# Patient Record
Sex: Female | Born: 1940 | Race: White | Hispanic: No | Marital: Married | Smoking: Never smoker
Health system: Southern US, Community
[De-identification: ages and names within clinical notes are randomized; demographics above are authoritative.]

## PROBLEM LIST (undated history)

## (undated) HISTORY — PX: REPLACEMENT TOTAL KNEE: SUR1224

## (undated) HISTORY — PX: CATARACT EXTRACTION: SUR2

---

## 2004-08-24 ENCOUNTER — Encounter: Admission: RE | Admit: 2004-08-24 | Discharge: 2004-08-24 | Payer: Self-pay | Admitting: Neurosurgery

## 2005-10-18 ENCOUNTER — Ambulatory Visit (HOSPITAL_COMMUNITY): Admission: RE | Admit: 2005-10-18 | Discharge: 2005-10-18 | Payer: Self-pay | Admitting: Neurosurgery

## 2007-11-26 ENCOUNTER — Encounter: Admission: RE | Admit: 2007-11-26 | Discharge: 2007-11-26 | Payer: Self-pay | Admitting: Family Medicine

## 2007-12-23 ENCOUNTER — Encounter: Admission: RE | Admit: 2007-12-23 | Discharge: 2007-12-23 | Payer: Self-pay | Admitting: Neurosurgery

## 2008-04-04 ENCOUNTER — Inpatient Hospital Stay (HOSPITAL_COMMUNITY): Admission: RE | Admit: 2008-04-04 | Discharge: 2008-04-05 | Payer: Self-pay | Admitting: Neurosurgery

## 2008-04-15 ENCOUNTER — Encounter: Admission: RE | Admit: 2008-04-15 | Discharge: 2008-04-15 | Payer: Self-pay | Admitting: Neurosurgery

## 2008-04-21 ENCOUNTER — Inpatient Hospital Stay (HOSPITAL_COMMUNITY): Admission: RE | Admit: 2008-04-21 | Discharge: 2008-04-24 | Payer: Self-pay | Admitting: Neurosurgery

## 2008-06-27 ENCOUNTER — Inpatient Hospital Stay (HOSPITAL_COMMUNITY): Admission: RE | Admit: 2008-06-27 | Discharge: 2008-06-28 | Payer: Self-pay | Admitting: Neurosurgery

## 2008-10-14 ENCOUNTER — Encounter: Admission: RE | Admit: 2008-10-14 | Discharge: 2008-10-14 | Payer: Self-pay | Admitting: Orthopedic Surgery

## 2010-02-15 ENCOUNTER — Encounter: Payer: Self-pay | Admitting: Orthopedic Surgery

## 2010-05-03 LAB — BASIC METABOLIC PANEL
BUN: 4 mg/dL — ABNORMAL LOW (ref 6–23)
Creatinine, Ser: 0.82 mg/dL (ref 0.4–1.2)
Glucose, Bld: 141 mg/dL — ABNORMAL HIGH (ref 70–99)
Sodium: 132 mEq/L — ABNORMAL LOW (ref 135–145)

## 2010-05-03 LAB — CBC
MCHC: 34.4 g/dL (ref 30.0–36.0)
MCV: 96 fL (ref 78.0–100.0)

## 2010-05-06 LAB — URINALYSIS, ROUTINE W REFLEX MICROSCOPIC
Glucose, UA: NEGATIVE mg/dL
Glucose, UA: NEGATIVE mg/dL
Hgb urine dipstick: NEGATIVE
Ketones, ur: NEGATIVE mg/dL
Protein, ur: NEGATIVE mg/dL
Specific Gravity, Urine: 1.012 (ref 1.005–1.030)
pH: 6.5 (ref 5.0–8.0)

## 2010-05-06 LAB — COMPREHENSIVE METABOLIC PANEL
AST: 33 U/L (ref 0–37)
AST: 30 U/L (ref 0–37)
Albumin: 3.3 g/dL — ABNORMAL LOW (ref 3.5–5.2)
Albumin: 3.6 g/dL (ref 3.5–5.2)
Alkaline Phosphatase: 48 U/L (ref 39–117)
Chloride: 103 mEq/L (ref 96–112)
Creatinine, Ser: 0.95 mg/dL (ref 0.4–1.2)
GFR calc Af Amer: >60 mL/min (ref >60)
GFR calc Af Amer: >60 mL/min (ref >60)
Glucose, Bld: 95 mg/dL (ref 70–99)
Potassium: 3.1 mEq/L — ABNORMAL LOW (ref 3.5–5.1)
Potassium: 3.4 mEq/L — ABNORMAL LOW (ref 3.5–5.1)
Sodium: 127 mEq/L — ABNORMAL LOW (ref 135–145)
Total Bilirubin: 0.7 mg/dL (ref 0.3–1.2)
Total Bilirubin: 0.8 mg/dL (ref 0.3–1.2)
Total Protein: 6.3 g/dL (ref 6.0–8.3)

## 2010-05-06 LAB — DIFFERENTIAL
Basophils Absolute: 0 10*3/uL (ref 0.0–0.1)
Basophils Relative: 0 % (ref 0–1)
Basophils Relative: 0 % (ref 0–1)
Eosinophils Absolute: 0.1 10*3/uL (ref 0.0–0.7)
Eosinophils Relative: 1 % (ref 0–5)
Lymphocytes Relative: 15 % (ref 12–46)
Lymphocytes Relative: 20 % (ref 12–46)
Monocytes Absolute: 0.7 10*3/uL (ref 0.1–1.0)
Monocytes Absolute: 0.7 10*3/uL (ref 0.1–1.0)
Monocytes Relative: 6 % (ref 3–12)
Monocytes Relative: 8 % (ref 3–12)
Neutro Abs: 9.7 10*3/uL — ABNORMAL HIGH (ref 1.7–7.7)

## 2010-05-06 LAB — URINE MICROSCOPIC-ADD ON

## 2010-05-06 LAB — BASIC METABOLIC PANEL
BUN: 4 mg/dL — ABNORMAL LOW (ref 6–23)
Calcium: 8.6 mg/dL (ref 8.4–10.5)
Creatinine, Ser: 0.84 mg/dL (ref 0.4–1.2)
GFR calc non Af Amer: >60 mL/min (ref >60)
Potassium: 3.8 mEq/L (ref 3.5–5.1)

## 2010-05-06 LAB — PROTIME-INR: Prothrombin Time: 11.8 seconds (ref 11.6–15.2)

## 2010-05-06 LAB — CBC
Hemoglobin: 13.4 g/dL (ref 12.0–15.0)
MCHC: 33.8 g/dL (ref 30.0–36.0)
Platelets: 261 10*3/uL (ref 150–400)
WBC: 8.7 10*3/uL (ref 4.0–10.5)
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
## 2010-06-08 NOTE — Op Note (Signed)
NAME:  Tonya [name], Tonya [name]               ACCOUNT NO.:  [id number]   MEDICAL RECORD NO.:  [id number]          PATIENT TYPE:  INP   LOCATION:  3001                         FACILITY:  [facility]   PHYSICIAN:  Danae Orleans. Venetia Maxon, M.D.  DATE OF BIRTH:  1940-11-12   DATE OF PROCEDURE:  06/27/2008  DATE OF DISCHARGE:                               OPERATIVE REPORT   PREOPERATIVE DIAGNOSIS:  L5 compression fracture.   POSTOPERATIVE DIAGNOSIS:  L5 compression fracture.   PROCEDURE:  L5 kyphoplasty.   SURGEON:  Danae Orleans. Venetia Maxon, M.D.   ANESTHESIA:  General endotracheal anesthesia.   ESTIMATED BLOOD LOSS:  Minimal.   COMPLICATIONS:  None.   DISPOSITION:  Recovery.   INDICATIONS:  [name] is a 70 year old woman with severe  osteoporosis who has undergone kyphoplasty procedure of L4, has now  developed compression fracture of L5, elected to perform a kyphoplasty  of this vertebral level.   PROCEDURE:  [name] was brought to the operating room.  Following  satisfactory and uncomplicated induction of general endotracheal  anesthesia and placement of intravenous lines, the patient was placed in  prone position on the operating table on chest and pelvic rolls.  Her  back was prepped and draped in usual sterile fashion after using biplane  C-arm fluoroscopy to localize the L5 level.  The skin and subcutaneous  tissues were infiltrated with local lidocaine.  Subsequently, an  introducer was placed initially on the right and subsequently on the  left and then 20 mL inflatable bone tamps were inserted in the vertebra  and with good filling and height restoration of vertebral fracture.  The  bone cement was then mixed and 9 mL of bone cement were placed, 3  complete fills on either side with good interdigitation within the  vertebra and good filling in AP and lateral and cephalocaudal  projections.  The introducers were removed and two 3-0 Vicryl stitches  were placed with Dermabond was used to  dress the skin edges.  The  patient was then extubated and taken to recovery in stable and  satisfactory condition having tolerated the operation well.  Counts were  correct at the end of the case.      Danae Orleans. Venetia Maxon, M.D.  Electronically Signed    JDS/MEDQ  D:  06/27/2008  T:  06/28/2008  Job:  [id number]

## 2010-06-08 NOTE — Op Note (Signed)
NAME:  Tonya [name], SHERE NO.:  [id number]   MEDICAL RECORD NO.:  [id number]          PATIENT TYPE:  INP   LOCATION:  3010                         FACILITY:  [facility]   PHYSICIAN:  Payton Doughty, M.D.      DATE OF BIRTH:  Jul 08, 1940   DATE OF PROCEDURE:  04/21/2008  DATE OF DISCHARGE:                               OPERATIVE REPORT   PREOPERATIVE DIAGNOSES:  1. L4 compression fracture.  2. L3-L4 lateral recess narrowing with a right L4 radiculopathy.   PROCEDURES:  1. L4 kyphoplasty done by Dr. Danielle Dess.  2. L3-L4 laminotomy and foraminotomy done by Dr. Channing Mutters.   SURGEON:  Payton Doughty, MD   ANESTHESIA:  General endotracheal.   PREPARATION:  Prepped and draped with alcohol wipe.   COMPLICATIONS:  None.   NURSE ASSISTANT:  Bedelia Person, MD   DOCTOR ASSISTANT:  Hilda Lias, MD   BODY OF TEXT:  A 70 year old girl had a L2-L3 laminotomy done 2 weeks  ago has done well, had a precipitous increasing low back pain and right  leg pain, and has an MR showing a new compression fracture at L4 and  lateral recess narrowing at L3-L4 and taken to the operating room.  Smoothly anesthetized, intubated, and placed prone on the operating  table.  Following shave, prep, and drape in the usual sterile fashion,  Dr. Danielle Dess performed the L4 kyphoplasty.  He will dictate that under  separate cover.  Attention was then turned to the lamina.  An incision  was made from the top of L3 to the bottom of L3 and the lamina of L3 and  the top of the lamina of L4 exposing the right-sided subperiosteal  plane.  Intraoperative x-ray confirmed correctness level.  Having  confirmed correctness level, hemi-semi-laminectomy was carried out of L3  up to the top of ligamentum flavum that was removed in retrograde  fashion.  L4 was undercut as well.  This allowed decompression of the  lateral recess.  L3-L4 roots were identified as they traversed this area  with the L3 exiting the neural foramen and the L4  traversing the disk  space.  They were completely decompressed.  Depo-Medrol soaked fat was  placed in the laminotomy defect.  Successive layers of 0 Vicryl, 2-0  Vicryl, and 3-0 nylon were used to close.  Betadine and Telfa dressing  was applied and made occlusive with OpSite.  The patient returned to the  recovery room in good condition.          ______________________________  Payton Doughty, M.D.    MWR/MEDQ  D:  04/21/2008  T:  04/21/2008  Job:  [id number]
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
## 2010-06-08 NOTE — Discharge Summary (Signed)
NAME:  Tonya [name], Tonya [name] NO.:  [id number]   MEDICAL RECORD NO.:  [id number]          PATIENT TYPE:  INP   LOCATION:  3010                         FACILITY:  [facility]   PHYSICIAN:  Payton Doughty, M.D.      DATE OF BIRTH:  1940-11-09   DATE OF ADMISSION:  04/21/2008  DATE OF DISCHARGE:  04/24/2008                               DISCHARGE SUMMARY   ADMITTING DIAGNOSES:  L3-L4 compression fracture and L3-L4 lateral  recess narrowing on the right.   DISCHARGE DIAGNOSES:  L3-L4 compression fracture and L3-L4 lateral  recess narrowing on the right.   PROCEDURES:  L4 kyphoplasty and L3-L4 right laminotomy/foraminotomy.   SURGEON:  Payton Doughty, MD   This is a 70 year old right-handed white girl whose history and physical  is recounted in the chart.  She basically had a decompression at T3 a  couple of weeks ago, did well, developed pain in her back and right leg.  MR showed a new compression fracture at L4 as well as right L4 lateral  recess narrowing.  She was admitted after ascertaining normal laboratory  values and underwent an L4 vertebroplasty and then L3-L4  laminotomy/foraminotomy on the right.  Postoperatively, she had some  persistent knee pain, but certainly better than it was.  She has been  placed on Neurontin for this.  She is up and about, able to walk.  She  does use a rolling walker on request.  Her strength is full.  Her  incision is well healing.  She is being discharged home in the care of  her family.  Her followup will be in the [employer].           ______________________________  Payton Doughty, M.D.     MWR/MEDQ  D:  04/24/2008  T:  04/24/2008  Job:  [id number]

## 2010-06-08 NOTE — Op Note (Signed)
NAME:  Tonya, [name] NO.:  [id number]   MEDICAL RECORD NO.:  [id number]          PATIENT TYPE:  INP   LOCATION:  2899                         FACILITY:  [facility]   PHYSICIAN:  Payton Doughty, M.D.      DATE OF BIRTH:  1940/06/28   DATE OF PROCEDURE:  04/04/2008  DATE OF DISCHARGE:                               OPERATIVE REPORT   PREOPERATIVE DIAGNOSIS:  Right L3 radiculopathy related to spondylosis  and synovial cyst.   POSTOPERATIVE DIAGNOSIS:  Right L3 radiculopathy related to spondylosis  and synovial cyst.   OPERATIVE PROCEDURE:  Right L2-L3 laminectomy for resection of a  synovial cyst.   SURGEON:  Payton Doughty, MD, Neurosurgery.   ANESTHESIA:  General endotracheal.   PREPARATION:  Prepped and draped with alcohol wipe.   COMPLICATIONS:  None.   NURSE ASSISTANT:  [place].   DOCTOR ASSISTANT:  Hilda Lias, M.D.   BODY OF TEXT:  A 70 year old girl with a right L3 radiculopathy related  to spondylosis and synovial cyst at L2-3.  She was taken to the  operating room, smoothly anesthetized and intubated, placed supine on  the operating table.  Pressure points were padded following shave, prep,  drape in the usual sterile fashion.  Marker was placed.  X-ray was  obtained to get this in the right area.  Incision was made directly over  the L2 lamina.  The lamina was dissected free in the subperiosteal  plane.  Second intraoperative x-ray confirmed correctness level.  Having  confirmed correctness level, hemi semi-laminectomy of L2 was carried out  to the top of ligamentum flavum and was removed in a retrograde fashion.  The medial portion of the facet was also removed.  The pars reticularis  was not disrupted.  This allowed the resection of the ligamentum flavum  and decompression of the right L3 nerve root.  Contained within the  ligamentum flavum was a synovial cyst.  Resection of this soft tissue  mass resulted in immediate decompression of the right L3  root.  This  carefully followed with a nerve hook out to the neural foramen and found  to be free.  The wound was irrigated and hemostasis assured.  It was  covered with Depo-Medrol soaked fat.  Successive layers of 0 Vicryl, 2-0  Vicryl, and 3-0 nylon were used to close.  Betadine and Telfa dressing  was applied and made occlusive with OpSite.  The patient returned to  recovery room in good condition.            ______________________________  Payton Doughty, M.D.    MWR/MEDQ  D:  04/04/2008  T:  04/05/2008  Job:  [id number]

## 2010-06-11 NOTE — Op Note (Signed)
NAME:  Tonya [name], Tonya [name]               ACCOUNT NO.:  [id number]   MEDICAL RECORD NO.:  [id number]          PATIENT TYPE:  INP   LOCATION:  3010                         FACILITY:  [facility]   PHYSICIAN:  Stefani Dama, M.D.  DATE OF BIRTH:  1940/12/25   DATE OF PROCEDURE:  06/05/2008  DATE OF DISCHARGE:  04/24/2008                               OPERATIVE REPORT   PREOPERATIVE DIAGNOSES:  1. L4 vertebral pathologic fracture.  2. Osteoporosis.   POSTOPERATIVE DIAGNOSIS:  1. L4 vertebral pathologic fracture.  2. Osteoporosis.   PROCEDURE:  Acrylic balloon kyphoplasty, L4.   SURGEON:  Stefani Dama, MD   ANESTHESIA:  General endotracheal.   INDICATIONS:  Tonya [name] is an 70 year old individual who has had  vertebral pathologic compression fracture, secondary to osteoporosis and  a simple full.  She has back pain in addition to radicular pain and Dr.  Channing Mutters asked me to see the patient to do acrylic balloon kyphoplasty while  his portion of procedure would include laminotomy and decompression of  the nerve root.   PROCEDURE:  The patient was brought to the operating room supine on a  stretcher.  After smooth induction of general endotracheal anesthesia,  she was turned prone.  The back was prepped with alcohol and DuraPrep,  and draped in a sterile fashion.  Then, using fluoroscopic guidance in  AP and lateral projection, the L4 vertebral body was identified; and  using fluoroscopic guidance, the extra pedicular space at L4 on the left  side was entered with a Jamshidi needle.  Skin above this area was  infiltrated with 2 mL of lidocaine with epinephrine and a #11 blade was  used to create a stab incision in this area.  The Jamshidi needle was  then passed through the lateral aspect of the pedicle at L4 and into the  top of the vertebral body, and once the vertebral body was entered, the  inner cannula of the Jamshidi needle was removed and a core biopsy of  bone was obtained.  The  opening was then infiltrated with the balloon,  and the balloon was infiltrated, and an AP and lateral radiography was  noted to extend across the midline.  The balloon was infiltrated to 150  mmHg pressure, then rapidly deteriorated to approximately 80-90 mm.  Once adequate filling and decompression of the vertebral body was  obtained via this technique, glue was allowed to harden to the  appropriate consistency after being mixed and when it reached the  appropriate level of patchiness, three cannulas of 1.5 mL of glue were  inserted into the vertebral body with good filling across the midline.  Once  filling reached posterior aspect of the vertebral body, it was decided  to stop the procedure.  The cannula was removed and a singular stitch  was placed in the entry site.  The patient tolerated the procedure well  and was then turned over to Dr. Trey Sailors for reprepping and redraping to  perform the decompressive portion of the operation.      Stefani Dama, M.D.  Electronically Signed  Stefani Dama, M.D.  Electronically Signed    HJE/MEDQ  D:  06/05/2008  T:  06/05/2008  Job:  [id number]

## 2010-07-05 IMAGING — CR DG HIP COMPLETE 2+V*R*
2 series · 2 of 2 positions shown · non-contrast
Comparison: None.

CLINICAL DATA: Right hip and low back pain.  Question compression
fracture.

RIGHT HIP - COMPLETE 2+ VIEW

[view not recorded (1 of 2)]
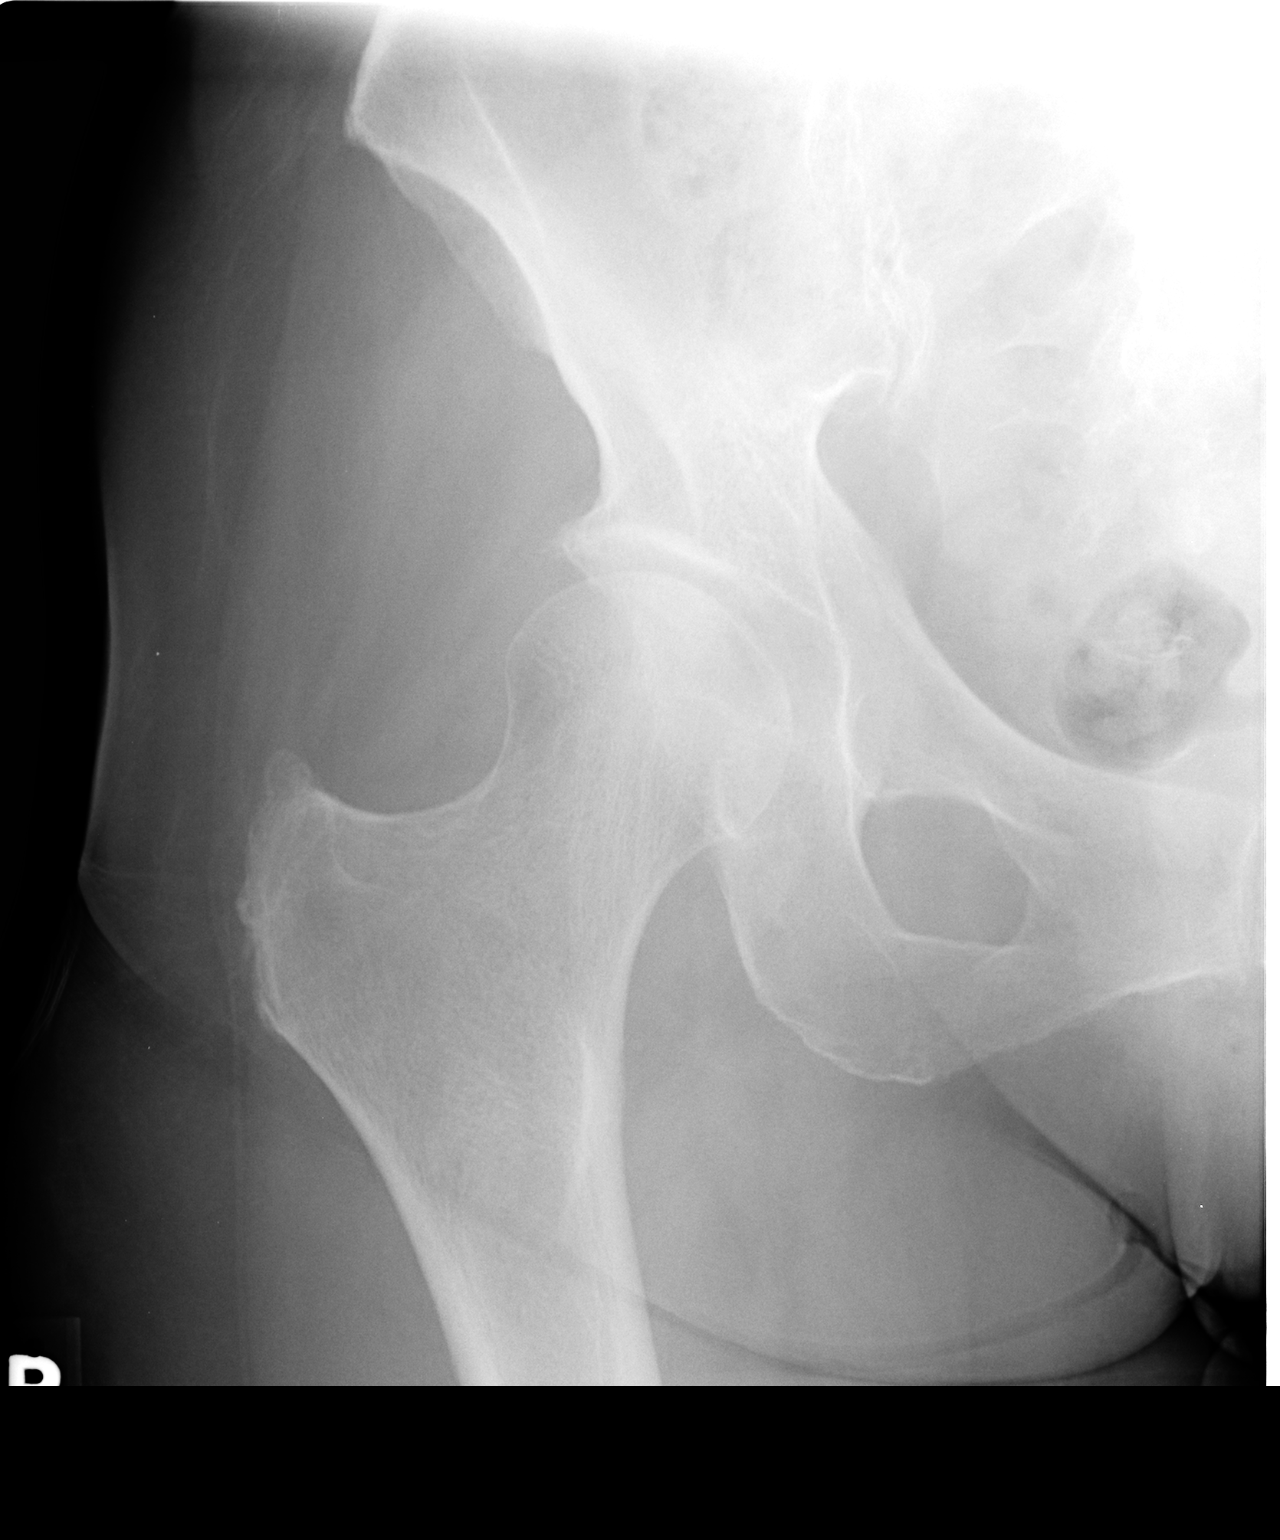

[view not recorded (2 of 2)]
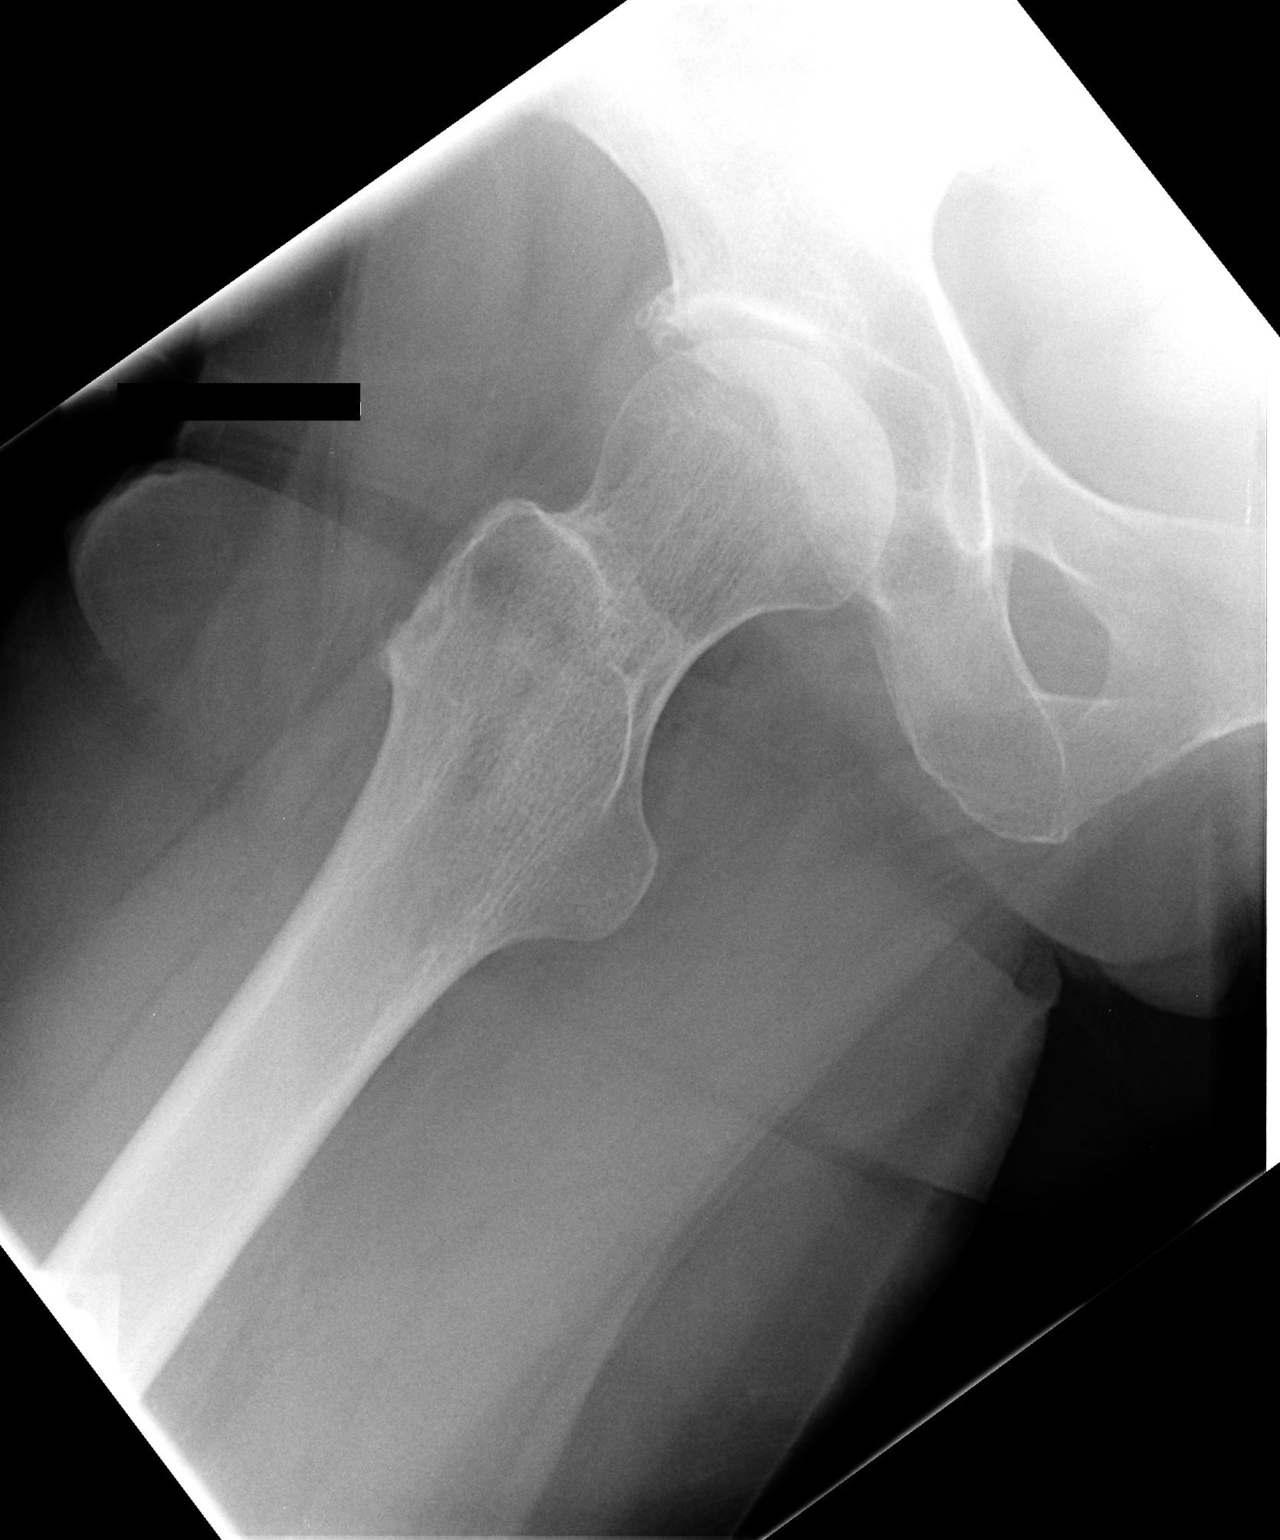

[2 of 2 positions shown; findings below may reference images not displayed]

FINDINGS: No fracture or dislocation.  Right hip joint space is
relatively well maintained.  There is mild subchondral sclerosis
and osteophytosis along the right acetabulum.
IMPRESSION: Degenerative changes without acute finding.

## 2012-11-26 ENCOUNTER — Ambulatory Visit
Admission: RE | Admit: 2012-11-26 | Discharge: 2012-11-26 | Disposition: A | Discharge status: Home / Self Care | Payer: PRIVATE HEALTH INSURANCE | Source: Ambulatory Visit | Attending: Neurosurgery | Admitting: Neurosurgery

## 2012-11-26 ENCOUNTER — Other Ambulatory Visit: Payer: Self-pay | Admitting: Neurosurgery

## 2012-11-26 DIAGNOSIS — M47816 Spondylosis without myelopathy or radiculopathy, lumbar region: Secondary | ICD-10-CM

## 2014-12-25 ENCOUNTER — Other Ambulatory Visit: Payer: Self-pay | Admitting: Neurosurgery

## 2014-12-25 ENCOUNTER — Ambulatory Visit (INDEPENDENT_AMBULATORY_CARE_PROVIDER_SITE_OTHER): Payer: PRIVATE HEALTH INSURANCE

## 2014-12-25 DIAGNOSIS — M545 Low back pain: Secondary | ICD-10-CM | POA: Diagnosis not present

## 2014-12-25 DIAGNOSIS — R52 Pain, unspecified: Secondary | ICD-10-CM

## 2015-10-02 ENCOUNTER — Emergency Department (INDEPENDENT_AMBULATORY_CARE_PROVIDER_SITE_OTHER)
Admission: EM | Admit: 2015-10-02 | Discharge: 2015-10-02 | Disposition: A | Discharge status: Home / Self Care | Payer: Medicare Other | Source: Home / Self Care | Attending: Family Medicine | Admitting: Family Medicine

## 2015-10-02 ENCOUNTER — Encounter: Payer: Self-pay | Admitting: *Deleted

## 2015-10-02 ENCOUNTER — Emergency Department (INDEPENDENT_AMBULATORY_CARE_PROVIDER_SITE_OTHER): Payer: Medicare Other

## 2015-10-02 DIAGNOSIS — R0781 Pleurodynia: Secondary | ICD-10-CM

## 2015-10-02 DIAGNOSIS — S2241XA Multiple fractures of ribs, right side, initial encounter for closed fracture: Secondary | ICD-10-CM | POA: Diagnosis not present

## 2015-10-02 DIAGNOSIS — S2241XK Multiple fractures of ribs, right side, subsequent encounter for fracture with nonunion: Secondary | ICD-10-CM

## 2015-10-02 DIAGNOSIS — W19XXXD Unspecified fall, subsequent encounter: Secondary | ICD-10-CM | POA: Diagnosis not present

## 2015-10-02 MED ORDER — CYCLOBENZAPRINE HCL 5 MG PO TABS: 5.0000 mg | ORAL_TABLET | Freq: Three times a day (TID) | ORAL | 0 refills | Status: AC | PRN

## 2015-10-02 NOTE — ED Provider Notes (Signed)
CSN: [id number]     Arrival date & time 10/02/15  1510 History   First MD Initiated Contact with Patient 10/02/15 1544     Chief Complaint  Patient presents with  . Chest Pain    Right lower rib pain   (Consider location/radiation/quality/duration/timing/severity/associated sxs/prior Treatment) HPI  Tonya [name] is a 75 y.o. female presenting to UC with c/o Right side lower rib pain that started this morning.  Pain was severe this morning and sharp, now 3/10. She has been taking tramadol as prescribed by her PCP for chronic back pain, it also helps with her rib pain.  She reports breaking 5 ribs about 1 month ago after tripping and falling. Pain had gradually resolved but this morning pain was so severe she is concerned she may have another fracture in her rib or injured her lung. Denies SOB. No new falls or injuries.  She does have a back brace she wears on occasion due to severe scoliosis but she cannot wear for long amounts of time due to fibromyalgia.  She f/u with her PCP last week and everything was going well. She does have hx of osteopenia per her "back doctor." denies fever, chills, n/v/d, cough or congestion.    History reviewed. No pertinent past medical history. Past Surgical History:  Procedure Laterality Date  . CATARACT EXTRACTION    . REPLACEMENT TOTAL KNEE     History reviewed. No pertinent family history. Social History  Substance Use Topics  . Smoking status: Never Smoker  . Smokeless tobacco: Never Used  . Alcohol use No   OB History    No data available     Review of Systems  Constitutional: Negative for appetite change, chills, fatigue and fever.  Respiratory: Negative for cough, chest tightness, shortness of breath and wheezing.   Cardiovascular: Positive for chest pain (Right side rib). Negative for palpitations.  Gastrointestinal: Negative for diarrhea, nausea and vomiting.  Musculoskeletal: Positive for back pain (Right mid back/rib?) and myalgias.  Negative for arthralgias.  Skin: Negative for color change, rash and wound.  Neurological: Negative for dizziness, light-headedness and headaches.    Allergies  Codeine and Prednisone  Home Medications   Prior to Admission medications   Medication Sig Start Date End Date Taking? Authorizing Provider  ALPRAZolam Prudy Feeler(XANAX) 0.5 MG tablet Take 0.5 mg by mouth at bedtime as needed for anxiety.   Yes Historical Provider, MD  aspirin EC 81 MG tablet Take 81 mg by mouth daily.   Yes Historical Provider, MD  Docusate Sodium (COLACE PO) Take by mouth.   Yes Historical Provider, MD  GABAPENTIN PO Take by mouth.   Yes Historical Provider, MD  traMADol (ULTRAM) 50 MG tablet Take by mouth 3 (three) times daily.   Yes Historical Provider, MD  cyclobenzaprine (FLEXERIL) 5 MG tablet Take 1 tablet (5 mg total) by mouth 3 (three) times daily as needed for muscle spasms. 10/02/15   Junius FinnerErin O'Malley, PA-C   Meds Ordered and Administered this Visit  Medications - No data to display  BP 139/70 (BP Location: Left Arm)   Pulse 82   Resp 16   Wt 108 lb (49 kg)   SpO2 94%  No data found.   Physical Exam  Constitutional: She is oriented to person, place, and time. She appears well-developed and well-nourished. No distress.  Pt sitting on her walker, appears well, alert, NAD.  HENT:  Head: Normocephalic and atraumatic.  Eyes: EOM are normal.  Neck: Normal range of motion.  Cardiovascular: Normal rate and regular rhythm.   Pulmonary/Chest: Effort normal and breath sounds normal. No respiratory distress. She has no wheezes. She has no rales. She exhibits tenderness.  Tenderness to Right side lower ribs from posterior to lateral aspect.  Lungs: CTAB. No respiratory distress.   Musculoskeletal: Normal range of motion.  Severe scoliosis and kyphosis.   Neurological: She is alert and oriented to person, place, and time.  Uses walker to help ambulate.   Skin: Skin is warm and dry. She is not diaphoretic.   Psychiatric: She has a normal mood and affect. Her behavior is normal.  Nursing note and vitals reviewed.   Urgent Care Course   Clinical Course    Procedures (including critical care time)  Labs Review Labs Reviewed - No data to display  Imaging Review Dg Ribs Unilateral W/chest Right  Result Date: 10/02/2015 CLINICAL DATA:  Prior fall with prior rib fractures.  Pain . EXAM: RIGHT RIBS AND CHEST - 3+ VIEW COMPARISON:  07/31/2009. FINDINGS: Mediastinum hilar structures normal. Heart size normal. Sliding hiatal hernia. No focal infiltrate. Biapical pleural parenchymal thickening consistent with scarring. Multiple displaced right rib fractures are present. Fractures appear to involve the seventh, eighth, ninth, tenth, and eleventh ribs. No prominent callus formations noted. No pneumothorax. Scratched IMPRESSION: Multiple right-sided rib fractures with slight displacement. No prominent callus formation noted. No pneumothorax. Electronically Signed   By: Maisie Fus  Register   On: 10/02/2015 16:13      MDM   1. Multiple rib fractures, right, closed, initial encounter   2. Rib pain on right side    Pt c/o Right side rib pain. Hx of rib fractures from fall 1 month ago. No new falls.  Pt is afebrile, no respiratory distress. O2 Sat 94% on RA.  Right side chest tenderness.  Plain films: significant for multiple Right-sided rib fractures with slight displacement. No prominent callus formation noted. No pneumothorax.  Consulted with Dr. Benjamin Stain, Sports Medicine, encouraged continued pain management as rib fractures can take 3 months to heal.  Discussed imaging with pt. No evidence of pneumopyothorax and no mention of pneumonia.  Pt declined rib belt here. She would like to try a muscle relaxer.  Rx: Flexeril 5mg  TID PRN (advised not to take with tramadol as both can cause drowsiness)  F/u with PCP or Orthopedist/Sports Medicine as needed for ongoing maintenance of rib fractures. Advised  to f/u if she develops fever, congestion, cough, or other signs of pneumonia. Patient verbalized understanding and agreement with treatment plan.      Junius Finner, PA-C 10/02/15 1721

## 2015-10-02 NOTE — Discharge Instructions (Signed)
°  Your rib fractures were still present on today's x-ray.  Unfortunately, rib fractures can take around 3 months to heal.  The goal in treating your fractures at this time is pain control.  Try to breath as normal as possible and to cough as needed to help prevent pneumonia.  If you develop fever, worsening pain, difficulty breathing, or worsening cough, please seek medical attention as you may have pneumonia setting in.  You can alternate cool or warm compresses, whichever feels most comfortable to you.  Flexeril is a muscle relaxer and may cause drowsiness. Do not drink alcohol, drive, or operate heavy machinery while taking.

## 2015-10-02 NOTE — ED Triage Notes (Signed)
Pt fell breaking 5 right ribs 1 month ago. They seemed to have healed but this AM awoke with severe right lower rib pain without new injury.
